# Patient Record
Sex: Female | Born: 1985 | Race: White | Hispanic: Yes | Marital: Single | Smoking: Never smoker
Health system: Southern US, Community
[De-identification: ages and names within clinical notes are randomized; demographics above are authoritative.]

---

## 2011-10-10 ENCOUNTER — Ambulatory Visit: Payer: Self-pay | Admitting: Internal Medicine

## 2013-07-10 IMAGING — US US PELV - US TRANSVAGINAL
1 series · 14 of 25 positions shown · non-contrast
Comparison: none

REASON FOR EXAM: CR [id number]  LLQ Abd Pain Tenderness
COMMENTS:

[Series 1: us pelv - us transvaginal · 0.33mm/px · 14 of 74 slices shown]
[im 1/74]
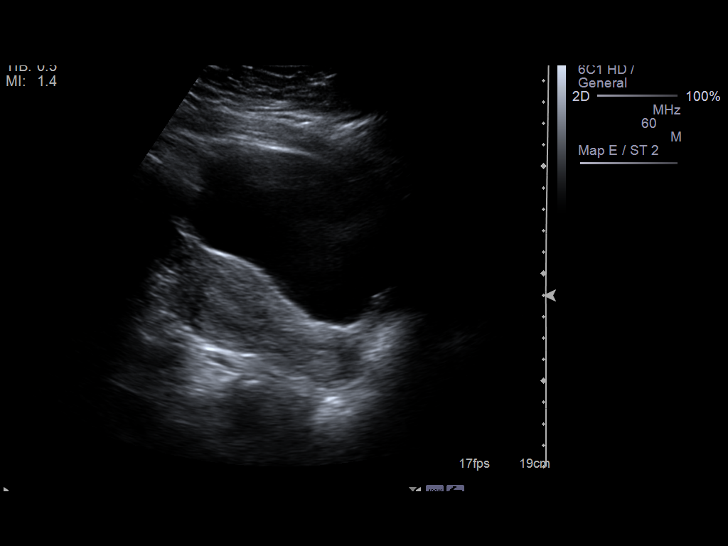
[im 7/74]
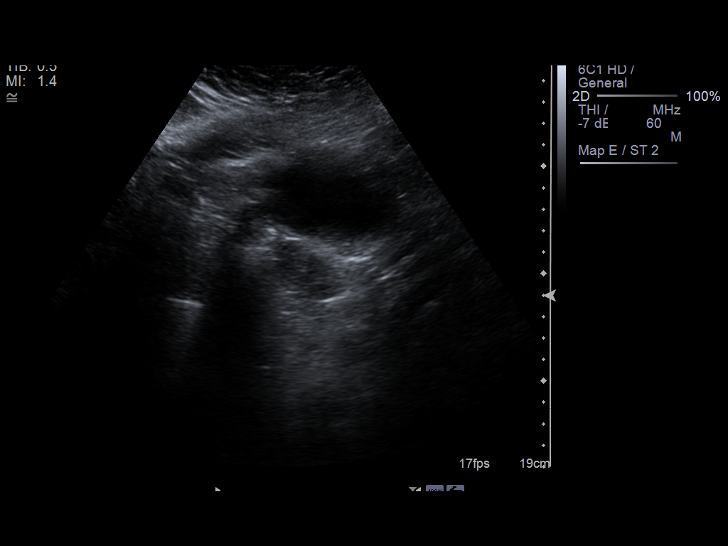
[im 13/74]
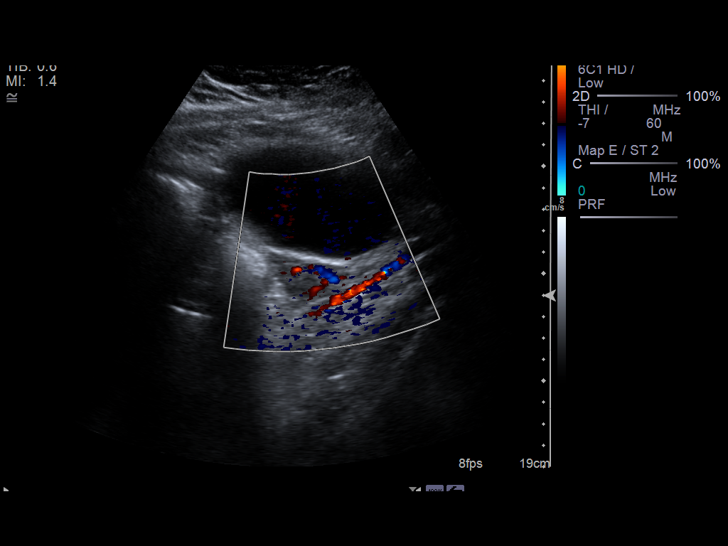
[im 19/74]
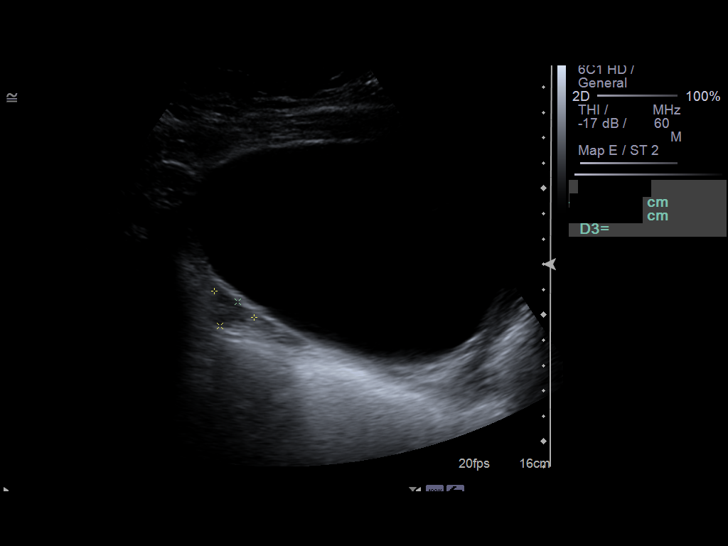
[im 25/74]
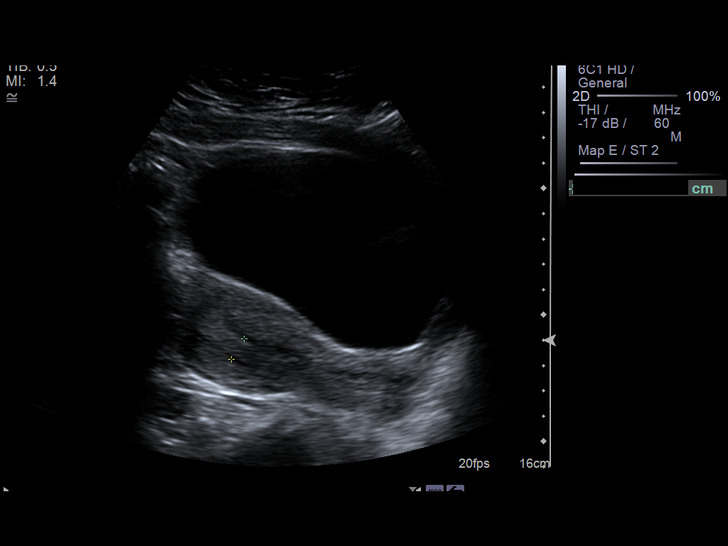
[im 28/74]
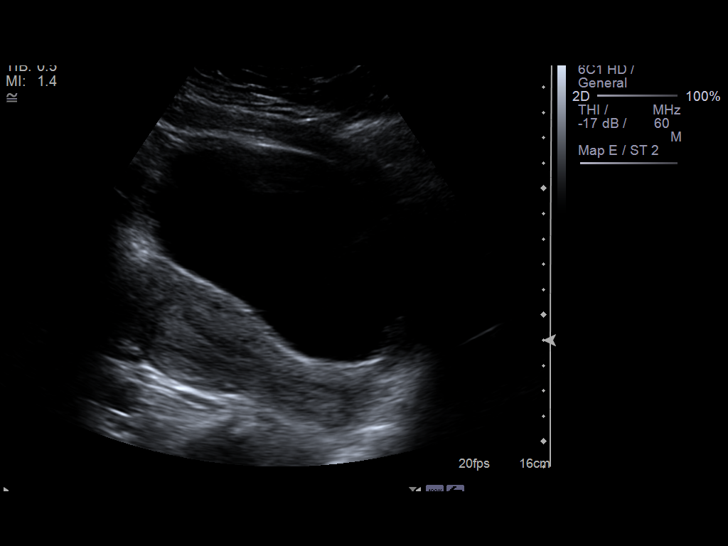
[im 34/74]
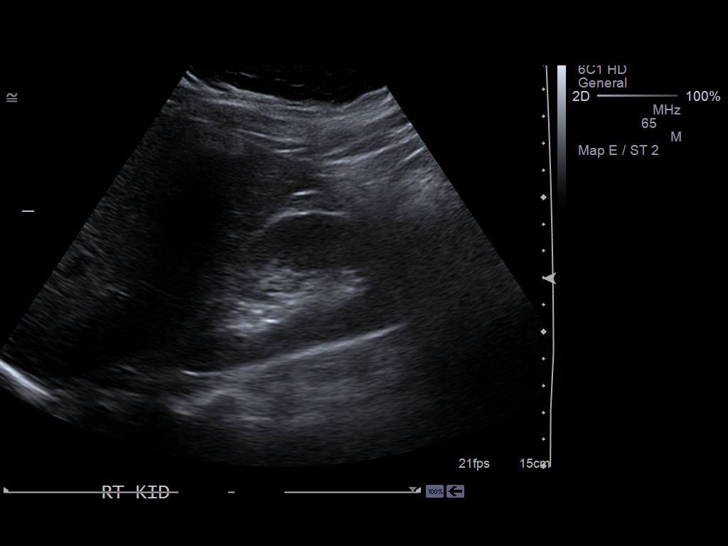
[im 40/74]
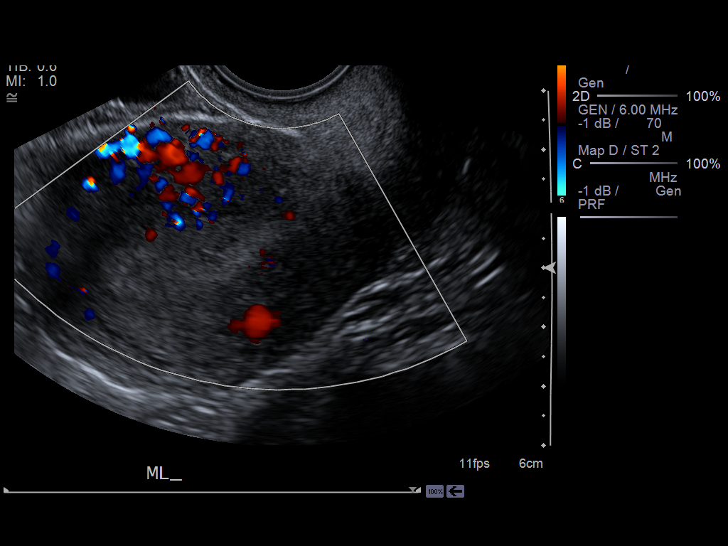
[im 46/74]
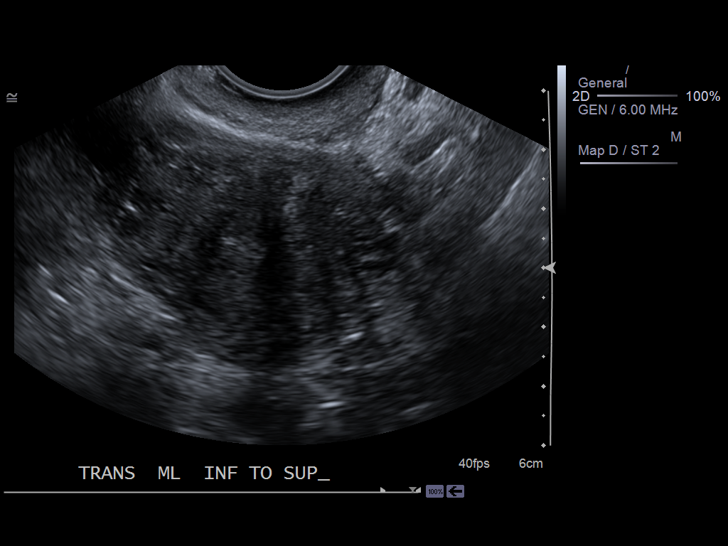
[im 49/74]
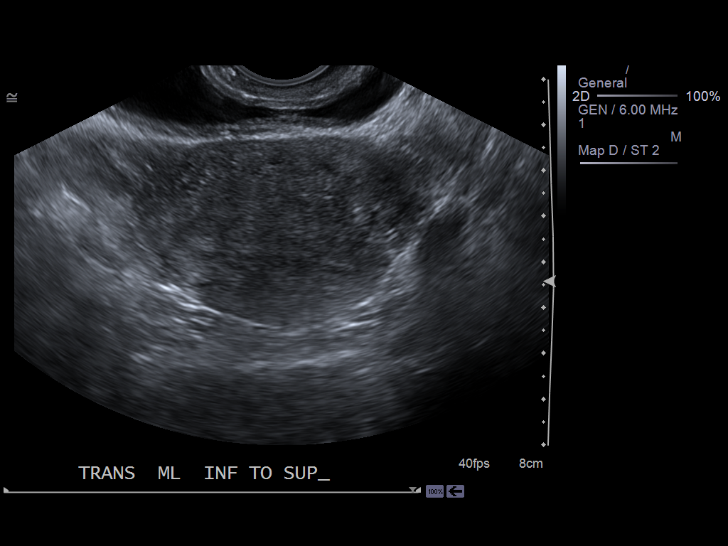
[im 55/74]
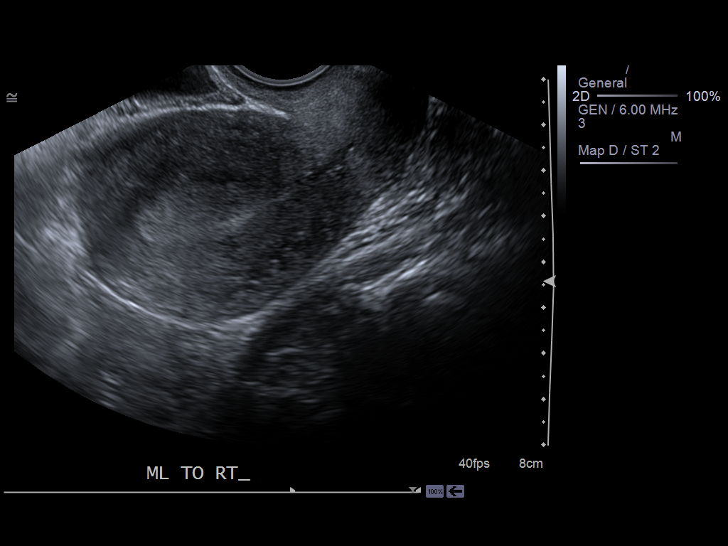
[im 61/74]
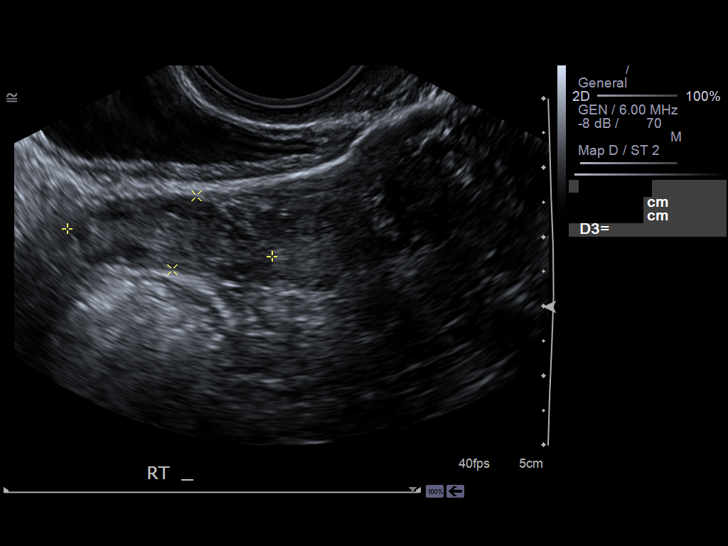
[im 67/74]
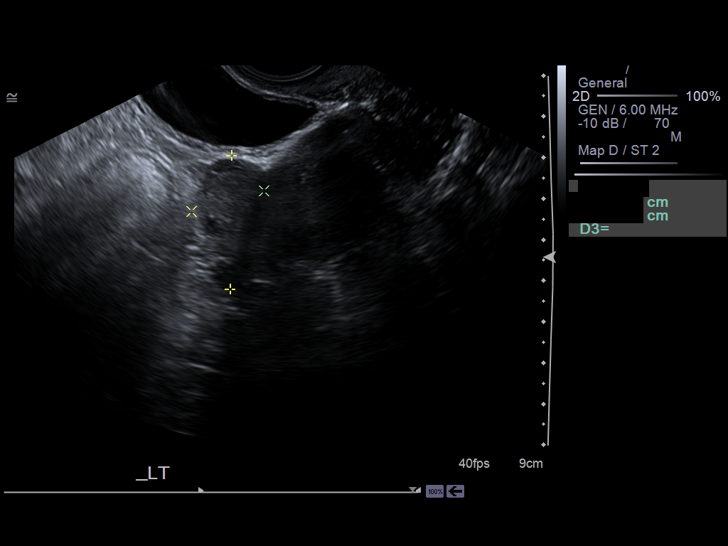
[im 74/74]
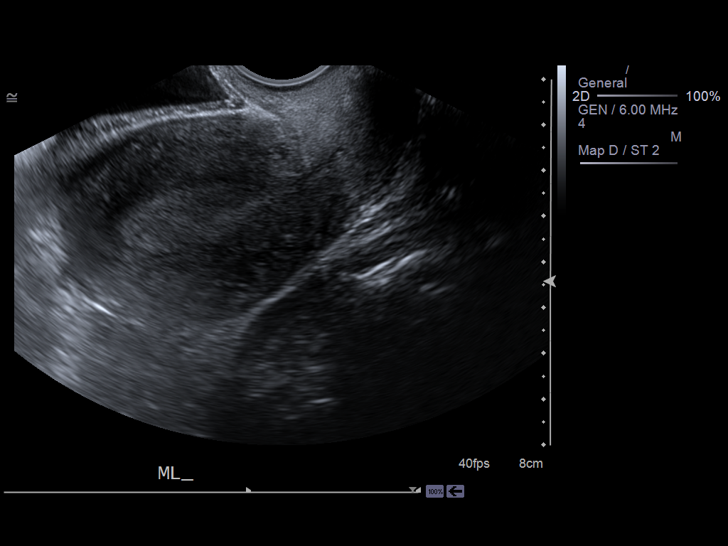

[14 of 25 positions shown; findings below may reference images not displayed]

PROCEDURE:     US  - US PELVIS EXAM W/TRANSVAGINAL  - October 10, 2011 [DATE]

RESULT:     Transabdominal and endovaginal pelvic sonogram demonstrates the
uterus has a grossly normal echotexture and measures 9.04 x 4.01 x 5.17 cm.
The right ovary measures 3.97 x 1.84 x 2.14 cm. The left ovary measures
x 1.19 x 2.16 cm. There is no evidence of ovarian torsion in either ovary.
The kidneys are grossly normal. Endometrial stripe thickness on endovaginal
examination is 1 point to 7 cm. Trace amount of cul-de-sac fluid is present.
IMPRESSION: Normal appearing pelvic sonogram.

[REDACTED]

## 2020-04-18 ENCOUNTER — Other Ambulatory Visit: Payer: Self-pay

## 2020-04-18 DIAGNOSIS — Z20822 Contact with and (suspected) exposure to covid-19: Secondary | ICD-10-CM

## 2020-04-21 LAB — NOVEL CORONAVIRUS, NAA: SARS-CoV-2, NAA: NOT DETECTED

## 2022-09-06 ENCOUNTER — Other Ambulatory Visit: Payer: Self-pay

## 2022-09-06 DIAGNOSIS — N644 Mastodynia: Secondary | ICD-10-CM

## 2022-09-07 ENCOUNTER — Encounter: Payer: Self-pay | Admitting: Family Medicine

## 2022-09-12 ENCOUNTER — Inpatient Hospital Stay
Admission: RE | Admit: 2022-09-12 | Discharge: 2022-09-12 | Disposition: A | Discharge status: Home / Self Care | Payer: Self-pay | Source: Ambulatory Visit | Attending: Family Medicine | Admitting: Family Medicine

## 2022-09-12 ENCOUNTER — Ambulatory Visit: Payer: Self-pay | Attending: Hematology and Oncology | Admitting: Hematology and Oncology

## 2022-09-12 ENCOUNTER — Other Ambulatory Visit: Payer: Self-pay | Admitting: *Deleted

## 2022-09-12 VITALS — BP 118/79 | Wt 216.6 lb

## 2022-09-12 DIAGNOSIS — N644 Mastodynia: Secondary | ICD-10-CM

## 2022-09-12 DIAGNOSIS — Z1231 Encounter for screening mammogram for malignant neoplasm of breast: Secondary | ICD-10-CM

## 2022-09-12 NOTE — Progress Notes (Signed)
Sylvia Sanders is a 37 y.o. female who presents to [facility] clinic today with complaint of right breast pain.    Pap Smear: Pap not smear completed today. Last Pap smear was 06/21/2021 at [facility] clinic and was normal. Per patient has no history of an abnormal Pap smear. Last Pap smear result is available in Epic.   Physical exam: Breasts Breasts symmetrical. No skin abnormalities bilateral breasts. No nipple retraction bilateral breasts. No nipple discharge bilateral breasts. No lymphadenopathy. No lumps palpated bilateral breasts.        Pelvic/Bimanual Pap is not indicated today    Smoking History: Patient has never smoked and was not referred to quit line.    Patient Navigation: Patient education provided. Access to services provided for patient through [facility] program. Delos Haring interpreter provided. No transportation provided   Colorectal Cancer Screening: Per patient has never had colonoscopy completed No complaints today.    Breast and Cervical Cancer Risk Assessment: Patient has family history of breast cancer, with her maternal aunt, paternal aunt and both maternal and paternal cousins. Patient does not have history of cervical dysplasia, immunocompromised, or DES exposure in-utero.  Risk Assessment   No risk assessment data     A: [facility] exam without pap smear Complaint of right breast pain centered at the nipple. This pain is throbbing and comes and goes. It is improved with Tylenol. Exam is normal.   P: Referred patient to the [facility] for a diagnostic mammogram. Appointment scheduled 09/22/21.  Pascal Lux, NP 09/12/2022 8:42 AM

## 2022-09-12 NOTE — Patient Instructions (Addendum)
Taught Sylvia Sanders about self breast awareness and gave educational materials to take home. Patient did not need a Pap smear today due to last Pap smear was in 06/21/21 per patient. Let her know [facility] will cover Pap smears every 5 years unless has a history of abnormal Pap smears. Referred patient to [facility] for diagnostic mammogram. Appointment scheduled for 09/23/22. Patient aware of appointment and will be there. Let patient know will follow up with her within the next couple weeks with results. Sylvia Sanders verbalized understanding.  Pascal Lux, NP 8:43 AM

## 2022-09-23 ENCOUNTER — Encounter: Payer: Self-pay | Admitting: Radiology

## 2022-09-23 ENCOUNTER — Ambulatory Visit
Admission: RE | Admit: 2022-09-23 | Discharge: 2022-09-23 | Disposition: A | Discharge status: Home / Self Care | Payer: Self-pay | Source: Ambulatory Visit | Attending: Obstetrics and Gynecology | Admitting: Obstetrics and Gynecology

## 2022-09-23 DIAGNOSIS — N644 Mastodynia: Secondary | ICD-10-CM
